# Patient Record
Sex: Female | Born: 1990 | Hispanic: Yes | Marital: Married | Smoking: Never smoker
Health system: Southern US, Community
[De-identification: ages and names within clinical notes are randomized; demographics above are authoritative.]

---

## 2019-08-08 ENCOUNTER — Encounter (HOSPITAL_COMMUNITY): Payer: Self-pay

## 2019-08-08 ENCOUNTER — Inpatient Hospital Stay (HOSPITAL_BASED_OUTPATIENT_CLINIC_OR_DEPARTMENT_OTHER): Payer: Medicaid Other

## 2019-08-08 ENCOUNTER — Other Ambulatory Visit: Payer: Self-pay

## 2019-08-08 ENCOUNTER — Inpatient Hospital Stay (HOSPITAL_COMMUNITY)
Admission: AD | Admit: 2019-08-08 | Discharge: 2019-08-08 | Disposition: A | Discharge status: Home / Self Care | Payer: Medicaid Other | Attending: Obstetrics and Gynecology | Admitting: Obstetrics and Gynecology

## 2019-08-08 DIAGNOSIS — R102 Pelvic and perineal pain: Secondary | ICD-10-CM | POA: Insufficient documentation

## 2019-08-08 DIAGNOSIS — O4703 False labor before 37 completed weeks of gestation, third trimester: Secondary | ICD-10-CM | POA: Diagnosis not present

## 2019-08-08 DIAGNOSIS — R109 Unspecified abdominal pain: Secondary | ICD-10-CM | POA: Diagnosis not present

## 2019-08-08 DIAGNOSIS — O26899 Other specified pregnancy related conditions, unspecified trimester: Secondary | ICD-10-CM | POA: Diagnosis not present

## 2019-08-08 DIAGNOSIS — Z3A28 28 weeks gestation of pregnancy: Secondary | ICD-10-CM | POA: Diagnosis not present

## 2019-08-08 DIAGNOSIS — O479 False labor, unspecified: Secondary | ICD-10-CM

## 2019-08-08 DIAGNOSIS — O26893 Other specified pregnancy related conditions, third trimester: Secondary | ICD-10-CM

## 2019-08-08 DIAGNOSIS — O4700 False labor before 37 completed weeks of gestation, unspecified trimester: Secondary | ICD-10-CM

## 2019-08-08 LAB — URINALYSIS, ROUTINE W REFLEX MICROSCOPIC
Bilirubin Urine: NEGATIVE
Glucose, UA: NEGATIVE mg/dL
Hgb urine dipstick: NEGATIVE
Ketones, ur: NEGATIVE mg/dL
Leukocytes,Ua: NEGATIVE
Nitrite: NEGATIVE
Protein, ur: NEGATIVE mg/dL
Specific Gravity, Urine: 1.01 (ref 1.005–1.030)
pH: 6 (ref 5.0–8.0)

## 2019-08-08 LAB — WET PREP, GENITAL
Clue Cells Wet Prep HPF POC: NONE SEEN
Sperm: NONE SEEN
Trich, Wet Prep: NONE SEEN
Yeast Wet Prep HPF POC: NONE SEEN

## 2019-08-08 LAB — COMPREHENSIVE METABOLIC PANEL
ALT: 22 U/L (ref 0–44)
AST: 19 U/L (ref 15–41)
Albumin: 3.2 g/dL — ABNORMAL LOW (ref 3.5–5.0)
Alkaline Phosphatase: 64 U/L (ref 38–126)
Anion gap: 5 (ref 5–15)
BUN: 9 mg/dL (ref 6–20)
CO2: 23 mmol/L (ref 22–32)
Calcium: 9.7 mg/dL (ref 8.9–10.3)
Chloride: 102 mmol/L (ref 98–111)
Creatinine, Ser: 0.56 mg/dL (ref 0.44–1.00)
GFR calc Af Amer: >60 mL/min (ref >60)
GFR calc non Af Amer: >60 mL/min (ref >60)
Glucose, Bld: 101 mg/dL — ABNORMAL HIGH (ref 70–99)
Potassium: 3.5 mmol/L (ref 3.5–5.1)
Sodium: 130 mmol/L — ABNORMAL LOW (ref 135–145)
Total Bilirubin: 0.6 mg/dL (ref 0.3–1.2)
Total Protein: 6.6 g/dL (ref 6.5–8.1)

## 2019-08-08 LAB — CBC WITH DIFFERENTIAL/PLATELET
Abs Immature Granulocytes: 0.12 10*3/uL — ABNORMAL HIGH (ref 0.00–0.07)
Basophils Absolute: 0 10*3/uL (ref 0.0–0.1)
Basophils Relative: 0 %
Eosinophils Absolute: 0.1 10*3/uL (ref 0.0–0.5)
Eosinophils Relative: 1 %
HCT: 32.1 % — ABNORMAL LOW (ref 36.0–46.0)
Hemoglobin: 11.4 g/dL — ABNORMAL LOW (ref 12.0–15.0)
Immature Granulocytes: 1 %
Lymphocytes Relative: 29 %
Lymphs Abs: 3.2 10*3/uL (ref 0.7–4.0)
MCH: 34.7 pg — ABNORMAL HIGH (ref 26.0–34.0)
MCHC: 35.5 g/dL (ref 30.0–36.0)
MCV: 97.6 fL (ref 80.0–100.0)
Monocytes Absolute: 0.8 10*3/uL (ref 0.1–1.0)
Monocytes Relative: 7 %
Neutro Abs: 6.8 10*3/uL (ref 1.7–7.7)
Neutrophils Relative %: 62 %
Platelets: 233 10*3/uL (ref 150–400)
RBC: 3.29 MIL/uL — ABNORMAL LOW (ref 3.87–5.11)
RDW: 13 % (ref 11.5–15.5)
WBC: 11.1 10*3/uL — ABNORMAL HIGH (ref 4.0–10.5)
nRBC: 0 % (ref 0.0–0.2)

## 2019-08-08 LAB — FETAL FIBRONECTIN: Fetal Fibronectin: NEGATIVE

## 2019-08-08 MED ORDER — IBUPROFEN 600 MG PO TABS
600.0000 mg | ORAL_TABLET | Freq: Once | ORAL | Status: AC
  Administered 2019-08-08: 20:00:00 600 mg via ORAL
  Filled 2019-08-08: qty 1

## 2019-08-08 MED ORDER — NIFEDIPINE 10 MG PO CAPS
10.0000 mg | ORAL_CAPSULE | ORAL | Status: AC
  Administered 2019-08-08 (×3): 10 mg via ORAL
  Filled 2019-08-08 (×3): qty 1

## 2019-08-08 NOTE — MAU Note (Signed)
Pt reports feeling pain and pressure lower abd that radiates to the back. Pt report LOF, however states she feels it's urine. Pt denies vag bleeding. Pt fetal movement. Pt reports urinary incont due to frequent infections.

## 2019-08-08 NOTE — MAU Provider Note (Addendum)
History     CSN: [id number]  Arrival date and time: 08/08/19 1740   First Provider Initiated Contact with Patient 08/08/19 1911      Chief Complaint  Patient presents with  . Abdominal Pain  . Vaginal Discharge   HPI   Ms.Alexa Smith is a 28 y.o. female G1P0 @ [redacted]w[redacted]d here in MAU with lower abdominal pain that has been present for 1 week. The pain is in her lower abdomen, in her pelvis. The pain comes and goes. She feels her abdomen getting tight.  Says she has been under a lot of stress due to a loss of a dog recently. She says that her husband has not been eating or sleeping since this happened and this has caused a lot of stress on the patient. She feels this stress has been causing contractions and pain in her abdomen. She herself is not eating or sleeping like she should. She does not drink water, she only drinks milk.   OB History    Gravida  1   Para      Term      Preterm      AB      Living        SAB      TAB      Ectopic      Multiple      Live Births           Obstetric Comments  IVF pregnancy         No past medical history on file.  History reviewed. No pertinent surgical history.  Family History  Problem Relation Age of Onset  . Diabetes Maternal Uncle   . Diabetes Paternal Grandmother   . Hypertension Mother     Social History   Tobacco Use  . Smoking status: Never Smoker  . Smokeless tobacco: Never Used  Substance Use Topics  . Alcohol use: Not Currently    Frequency: Never  . Drug use: Never    Allergies: No Known Allergies  No medications prior to admission.   Results for orders placed or performed during the hospital encounter of 08/08/19 (from the past 48 hour(s))  Urinalysis, Routine w reflex microscopic     Status: Abnormal   Collection Time: 08/08/19  7:10 PM  Result Value Ref Range   Color, Urine STRAW (A) YELLOW   APPearance CLEAR CLEAR   Specific Gravity, Urine 1.010 1.005 - 1.030   pH 6.0 5.0 - 8.0   Glucose, UA NEGATIVE NEGATIVE mg/dL   Hgb urine dipstick NEGATIVE NEGATIVE   Bilirubin Urine NEGATIVE NEGATIVE   Ketones, ur NEGATIVE NEGATIVE mg/dL   Protein, ur NEGATIVE NEGATIVE mg/dL   Nitrite NEGATIVE NEGATIVE   Leukocytes,Ua NEGATIVE NEGATIVE    Comment: Performed at [facility] [address], [place], [place] [address]  Wet prep, genital     Status: Abnormal   Collection Time: 08/08/19  7:47 PM  Result Value Ref Range   Yeast Wet Prep HPF POC NONE SEEN NONE SEEN   Trich, Wet Prep NONE SEEN NONE SEEN   Clue Cells Wet Prep HPF POC NONE SEEN NONE SEEN   WBC, Wet Prep HPF POC MANY (A) NONE SEEN   Sperm NONE SEEN     Comment: Performed at [facility], [place] [address] [place], [place] [address]  Fetal fibronectin     Status: None   Collection Time: 08/08/19  7:47 PM  Result Value Ref Range  Fetal Fibronectin NEGATIVE NEGATIVE    Comment: Performed at [facility], [address] [place], [place] [address]  CBC with Differential/Platelet     Status: Abnormal   Collection Time: 08/08/19  8:26 PM  Result Value Ref Range   WBC 11.1 (H) 4.0 - 10.5 K/uL   RBC 3.29 (L) 3.87 - 5.11 MIL/uL   Hemoglobin 11.4 (L) 12.0 - 15.0 g/dL   HCT 60.432.1 (L) 54.036.0 - 98.146.0 %   MCV 97.6 80.0 - 100.0 fL   MCH 34.7 (H) 26.0 - 34.0 pg   MCHC 35.5 30.0 - 36.0 g/dL   RDW 19.113.0 47.811.5 - 29.515.5 %   Platelets 233 150 - 400 K/uL   nRBC 0.0 0.0 - 0.2 %   Neutrophils Relative % 62 %   Neutro Abs 6.8 1.7 - 7.7 K/uL   Lymphocytes Relative 29 %   Lymphs Abs 3.2 0.7 - 4.0 K/uL   Monocytes Relative 7 %   Monocytes Absolute 0.8 0.1 - 1.0 K/uL   Eosinophils Relative 1 %   Eosinophils Absolute 0.1 0.0 - 0.5 K/uL   Basophils Relative 0 %   Basophils Absolute 0.0 0.0 - 0.1 K/uL   Immature Granulocytes 1 %   Abs Immature Granulocytes 0.12 (H) 0.00 - 0.07 K/uL    Comment: Performed at [facility], [address] [place], [place] [address]    Review of Systems  Constitutional: Negative for  fever.  Gastrointestinal: Positive for abdominal pain.  Genitourinary: Positive for pelvic pain. Negative for dysuria, frequency, vaginal bleeding and vaginal discharge.   Physical Exam   Blood pressure 117/79, pulse 86, temperature 98.6 F (37 C), temperature source Oral, resp. rate 20, height 5\' 7"  (1.702 m), weight 77.2 kg, SpO2 100 %.  Physical Exam  Constitutional: She is oriented to person, place, and time. She appears well-developed and well-nourished. No distress.  HENT:  Head: Normocephalic.  Eyes: Pupils are equal, round, and reactive to light.  GI: Normal appearance. There is generalized abdominal tenderness. There is no rigidity, no rebound and no guarding.  Genitourinary:    Genitourinary Comments: Vagina - Small amount of white vaginal discharge, no odor  Cervix - No contact bleeding, no active bleeding. Cervix appears slightly open  Bimanual exam: Cervix FT, middle  GC/Chlam, wet prep done Chaperone present for exam.    Musculoskeletal: Normal range of motion.  Neurological: She is alert and oriented to person, place, and time.  Skin: Skin is warm. She is not diaphoretic.  Psychiatric: Her behavior is normal.   Fetal Tracing: Baseline: 135 bpm Variability: moderate  Accelerations: 10x10 Decelerations: None Toco: Occasional contractions MAU Course  Procedures  None  MDM  Wet prep collected  FFN collected- Negative  Ibuprofen 600 mg po given  Contractions noted on monitor: procardia given oral  US Limited  Report given to Dr. Crissie ReeseEckstat who resumes care of the patient. Patient is in US at this time.  Alexa Smith, Alexa RutherfordJennifer Smith, Alexa Smith  Assessment and Plan     Addendum:  US unremarkable. Wet prep negative. Cervix rechecked and remains closed. Patient reports she feels better and abdominal pain feels more like gas and less like it did this morning. Continues to have intermittent contractions on toco but given no cervical change not in preterm labor, and negative FFN  is reassuring. Discussed preterm labor precautions at length with patient.  D/c to home in stable condition.   Alexa MaplesMatthew M Braxdon Smith

## 2019-08-08 NOTE — Discharge Instructions (Signed)
Preterm Labor and Birth Information °Pregnancy normally lasts 39-41 weeks. Preterm labor is when labor starts early. It starts before you have been pregnant for 37 whole weeks. °What are the risk factors for preterm labor? °Preterm labor is more likely to occur in women who: °· Have an infection while pregnant. °· Have a cervix that is short. °· Have gone into preterm labor before. °· Have had surgery on their cervix. °· Are younger than age 30. °· Are older than age 35. °· Are African American. °· Are pregnant with two or more babies. °· Take street drugs while pregnant. °· Smoke while pregnant. °· Do not gain enough weight while pregnant. °· Got pregnant right after another pregnancy. °What are the symptoms of preterm labor? °Symptoms of preterm labor include: °· Cramps. The cramps may feel like the cramps some women get during their period. The cramps may happen with watery poop (diarrhea). °· Pain in the belly (abdomen). °· Pain in the lower back. °· Regular contractions or tightening. It may feel like your belly is getting tighter. °· Pressure in the lower belly that seems to get stronger. °· More fluid (discharge) leaking from the vagina. The fluid may be watery or bloody. °· Water breaking. °Why is it important to notice signs of preterm labor? °Babies who are born early may not be fully developed. They have a higher chance for: °· Long-term heart problems. °· Long-term lung problems. °· Trouble controlling body systems, like breathing. °· Bleeding in the brain. °· A condition called cerebral palsy. °· Learning difficulties. °· Death. °These risks are highest for babies who are born before 34 weeks of pregnancy. °How is preterm labor treated? °Treatment depends on: °· How long you were pregnant. °· Your condition. °· The health of your baby. °Treatment may involve: °· Having a stitch (suture) placed in your cervix. When you give birth, your cervix opens so the baby can come out. The stitch keeps the cervix  from opening too soon. °· Staying at the hospital. °· Taking or getting medicines, such as: °? Hormone medicines. °? Medicines to stop contractions. °? Medicines to help the baby’s lungs develop. °? Medicines to prevent your baby from having cerebral palsy. °What should I do if I am in preterm labor? °If you think you are going into labor too soon, call your doctor right away. °How can I prevent preterm labor? °· Do not use any tobacco products. °? Examples of these are cigarettes, chewing tobacco, and e-cigarettes. °? If you need help quitting, ask your doctor. °· Do not use street drugs. °· Do not use any medicines unless you ask your doctor if they are safe for you. °· Talk with your doctor before taking any herbal supplements. °· Make sure you gain enough weight. °· Watch for infection. If you think you might have an infection, get it checked right away. °· If you have gone into preterm labor before, tell your doctor. °This information is not intended to replace advice given to you by your health care provider. Make sure you discuss any questions you have with your health care provider. °Document Released: 01/31/2009 Document Revised: 02/26/2019 Document Reviewed: 03/27/2016 °Elsevier Patient Education © 2020 [employer] ° °

## 2019-08-08 NOTE — MAU Note (Signed)
Alexa Smith is a 28 y.o. at [redacted]w[redacted]d here in MAU reporting: for the past week she has been under a lot of stress at work and with her dog dying. Starting yesterday she feels like her belly is lower and she is having a lot of pressure and her belly has been feeling tight and is painful. Also having back pain. No bleeding. Unsure if she is leaking fluid, states she has noticed increased dampness in her panties for the past 2 days. +FM  Was getting [facility] in [place], last appointment was 08/31. States she is planning to deliver here but is thinking she will continue care in [place]  Onset of complaint: yesterday  Pain score: abdominal pain 4/10, back pain 4/10  Vitals:   08/08/19 1803  BP: 134/70  Pulse: 95  Resp: 18  Temp: 98.5 F (36.9 C)  SpO2: 99%     FHT: +FM  Lab orders placed from triage: UA

## 2022-02-22 ENCOUNTER — Emergency Department
Admission: EM | Admit: 2022-02-22 | Discharge: 2022-02-23 | Disposition: A | Discharge status: Home / Self Care | Payer: Medicaid Other | Attending: Emergency Medicine | Admitting: Emergency Medicine

## 2022-02-22 ENCOUNTER — Other Ambulatory Visit: Payer: Self-pay

## 2022-02-22 ENCOUNTER — Emergency Department: Payer: Medicaid Other

## 2022-02-22 DIAGNOSIS — Z3A01 Less than 8 weeks gestation of pregnancy: Secondary | ICD-10-CM | POA: Insufficient documentation

## 2022-02-22 DIAGNOSIS — R102 Pelvic and perineal pain: Secondary | ICD-10-CM

## 2022-02-22 DIAGNOSIS — O219 Vomiting of pregnancy, unspecified: Secondary | ICD-10-CM | POA: Insufficient documentation

## 2022-02-22 DIAGNOSIS — O99611 Diseases of the digestive system complicating pregnancy, first trimester: Secondary | ICD-10-CM | POA: Diagnosis not present

## 2022-02-22 DIAGNOSIS — O26899 Other specified pregnancy related conditions, unspecified trimester: Secondary | ICD-10-CM

## 2022-02-22 LAB — URINALYSIS, ROUTINE W REFLEX MICROSCOPIC
Bilirubin Urine: NEGATIVE
Glucose, UA: NEGATIVE mg/dL
Hgb urine dipstick: NEGATIVE
Ketones, ur: NEGATIVE mg/dL
Leukocytes,Ua: NEGATIVE
Nitrite: NEGATIVE
Protein, ur: NEGATIVE mg/dL
Specific Gravity, Urine: 1.012 (ref 1.005–1.030)
pH: 6 (ref 5.0–8.0)

## 2022-02-22 LAB — CBC
HCT: 36.4 % (ref 36.0–46.0)
Hemoglobin: 12.1 g/dL (ref 12.0–15.0)
MCH: 31.1 pg (ref 26.0–34.0)
MCHC: 33.2 g/dL (ref 30.0–36.0)
MCV: 93.6 fL (ref 80.0–100.0)
Platelets: 267 10*3/uL (ref 150–400)
RBC: 3.89 MIL/uL (ref 3.87–5.11)
RDW: 13.1 % (ref 11.5–15.5)
WBC: 7.4 10*3/uL (ref 4.0–10.5)
nRBC: 0 % (ref 0.0–0.2)

## 2022-02-22 LAB — POC URINE PREG, ED: Preg Test, Ur: POSITIVE — AB

## 2022-02-22 MED ORDER — ONDANSETRON HCL 4 MG/2ML IJ SOLN
4.0000 mg | Freq: Once | INTRAMUSCULAR | Status: AC
  Administered 2022-02-22: 4 mg via INTRAVENOUS
  Filled 2022-02-22: qty 2

## 2022-02-22 MED ORDER — ACETAMINOPHEN 500 MG PO TABS
1000.0000 mg | ORAL_TABLET | Freq: Once | ORAL | Status: AC
  Administered 2022-02-22: 1000 mg via ORAL
  Filled 2022-02-22: qty 2

## 2022-02-22 MED ORDER — LACTATED RINGERS IV BOLUS
1000.0000 mL | Freq: Once | INTRAVENOUS | Status: AC
  Administered 2022-02-22: 1000 mL via INTRAVENOUS

## 2022-02-22 NOTE — ED Triage Notes (Signed)
Epigastric pain through to back since this AM, worsening tonight. (+)nausea, no vomiting. 2 episodes of loose stools. Reports child has stomach virus. Reports (+)HPT last Thursday. Denies vaginal bleeding.  ?

## 2022-02-22 NOTE — ED Provider Notes (Signed)
? ?[facility] ?Provider Note ? ? ? Event Date/Time  ? First MD Initiated Contact with Patient 02/22/22 2317   ?  (approximate) ? ? ?History  ? ?Chief Complaint ?Abdominal Pain ? ? ?HPI ? ?Alexa Smith is a 31 y.o. female, [phone] with no significant past medical history presents to the ED complaining of complaining of abdominal pain.  Patient reports that she has been dealing with with gradually worsening pain in her epigastrium over the past 12 hours.  She describes pain as sharp and constant, associated with nausea and multiple episodes of vomiting.  She denies any changes in her bowel movements and has not had any dysuria, hematuria, vaginal bleeding, or discharge.  She additionally denies any fevers or flank pain.  She believes she is about [redacted] weeks pregnant at this time, states her LMP was on February 22.  She has not yet established care with OB/GYN, has an appointment later this month, but has not yet had an ultrasound.  She reports occasional "gas pain" with this pregnancy thus far, but otherwise denies any significant issues.  She has never had any abdominal surgeries beyond prior cesarean section. ? ?  ? ? ?Physical Exam  ? ?Triage Vital Signs: ?ED Triage Vitals  ?Enc Vitals Group  ?   BP 02/22/22 2301 115/76  ?   Pulse Rate 02/22/22 2301 88  ?   Resp 02/22/22 2301 20  ?   Temp 02/22/22 2301 98.9 ?F (37.2 ?C)  ?   Temp Source 02/22/22 2301 Oral  ?   SpO2 02/22/22 2301 99 %  ?   Weight 02/22/22 2303 154 lb (69.9 kg)  ?   Height 02/22/22 2303 5\' 7"  (1.702 m)  ?   Head Circumference --   ?   Peak Flow --   ?   Pain Score 02/22/22 2302 9  ?   Pain Loc --   ?   Pain Edu? --   ?   Excl. in GC? --   ? ? ?Most recent vital signs: ?Vitals:  ? 02/22/22 2301 02/23/22 0000  ?BP: 115/76 111/83  ?Pulse: 88 82  ?Resp: 20 20  ?Temp: 98.9 ?F (37.2 ?C)   ?SpO2: 99% 100%  ? ? ?Constitutional: Alert and oriented. ?Eyes: Conjunctivae are normal. ?Head: Atraumatic. ?Nose: No  congestion/rhinnorhea. ?Mouth/Throat: Mucous membranes are moist.  ?Cardiovascular: Normal rate, regular rhythm. Grossly normal heart sounds.  2+ radial pulses bilaterally. ?Respiratory: Normal respiratory effort.  No retractions. Lungs CTAB. ?Gastrointestinal: Soft and tender to palpation in the epigastrium with no rebound or guarding. No distention. ?Musculoskeletal: No lower extremity tenderness nor edema.  ?Neurologic:  Normal speech and language. No gross focal neurologic deficits are appreciated. ? ? ? ?ED Results / Procedures / Treatments  ? ?Labs ?(all labs ordered are listed, but only abnormal results are displayed) ?Labs Reviewed  ?COMPREHENSIVE METABOLIC PANEL - Abnormal; Notable for the following components:  ?    Result Value  ? Sodium 134 (*)   ? Glucose, Bld 144 (*)   ? All other components within normal limits  ?URINALYSIS, ROUTINE W REFLEX MICROSCOPIC - Abnormal; Notable for the following components:  ? Color, Urine YELLOW (*)   ? APPearance CLEAR (*)   ? All other components within normal limits  ?HCG, QUANTITATIVE, PREGNANCY - Abnormal; Notable for the following components:  ? hCG, Beta 04/25/22 20,335 (*)   ? All other components within normal limits  ?POC URINE PREG, ED -  Abnormal; Notable for the following components:  ? Preg Test, Ur POSITIVE (*)   ? All other components within normal limits  ?LIPASE, BLOOD  ?CBC  ? ? ? ?RADIOLOGY ?Right upper quadrant ultrasound reviewed by me with no gallstones, wall thickening, or pericholecystic fluid.  Pelvic ultrasound shows intrauterine gestational sac and yolk sac with no visible embryo. ? ?PROCEDURES: ? ?Critical Care performed: No ? ?Procedures ? ? ?MEDICATIONS ORDERED IN ED: ?Medications  ?lactated ringers bolus 1,000 mL (1,000 mLs Intravenous New Bag/Given 02/22/22 2354)  ?ondansetron [facility]) injection 4 mg (4 mg Intravenous Given 02/22/22 2356)  ?acetaminophen (TYLENOL) tablet 1,000 mg (1,000 mg Oral Given 02/22/22 2356)  ? ? ? ?IMPRESSION / MDM  / ASSESSMENT AND PLAN / ED COURSE  ?I reviewed the triage vital signs and the nursing notes. ?             ?               ? ?31 y.o. female G3P1011 at approximately 6 weeks of pregnancy with no significant past medical history presents to the ED complaining of 12 hours of worsening epigastric pain associated with nausea and vomiting. ? ?Differential diagnosis includes, but is not limited to, ectopic pregnancy, hyperemesis gravidarum, biliary colic, pancreatitis, hepatitis, choledocholithiasis, gastritis, GERD, UTI. ? ?Patient is nontoxic-appearing and in no acute distress, vital signs are unremarkable but she does have significant tenderness in her epigastrium.  We will further assess with ultrasound for ectopic pregnancy as well as biliary pathology.  Point-of-care pregnancy testing is positive and beta hCG is pending.  Additional labs including LFTs and lipase are also pending.  Patient was offered IV pain medication but prefers to proceed with Tylenol at this time, we will also hydrate with IV fluids and treat with IV Zofran. ? ?Labs are reassuring with beta hCG levels appropriately elevated, CBC without anemia or leukocytosis, LFTs and lipase are within normal limits.  Right upper quadrant ultrasound shows no evidence of biliary pathology and pelvic ultrasound shows intrauterine gestational sac and yolk sac with no visible embryo.  Given patient's dates with no visible embryo, presentation concerning for possible early miscarriage or failed pregnancy.  Patient is appropriate for discharge home, but was counseled to closely follow-up with her OB/GYN and to return to the ED for new worsening symptoms.  Patient agrees with plan. ? ?  ? ? ?FINAL CLINICAL IMPRESSION(S) / ED DIAGNOSES  ? ?Final diagnoses:  ?Abdominal pain affecting pregnancy  ? ? ? ?Rx / DC Orders  ? ?ED Discharge Orders   ? ?      Ordered  ?  ondansetron (ZOFRAN-ODT) 4 MG disintegrating tablet  Every 8 hours PRN       ? 02/23/22 0222  ? ?  ?  ? ?   ? ? ? ?Note:  This document was prepared using Dragon voice recognition software and may include unintentional dictation errors. ?  ?Chesley Noon, MD ?02/23/22 [phone] ? ?

## 2022-02-22 NOTE — ED Notes (Addendum)
Pt to ED for Upper abdominal pain that started this AM. Pt states she feels like a pressure, burning sensation that is in RUQ and shoulder. Pt has episodes of nausea, but denies emesis. Pt states she is also pregnant, last known period was around feb 28th, states she normally has irregular periods. Denies any new vaginal bleeding. Denies diarrhea and urinary symptoms ? ?Pt is A&OX4  ?

## 2022-02-23 ENCOUNTER — Emergency Department: Payer: Medicaid Other

## 2022-02-23 LAB — COMPREHENSIVE METABOLIC PANEL
ALT: 13 U/L (ref 0–44)
AST: 17 U/L (ref 15–41)
Albumin: 4.2 g/dL (ref 3.5–5.0)
Alkaline Phosphatase: 50 U/L (ref 38–126)
Anion gap: 7 (ref 5–15)
BUN: 8 mg/dL (ref 6–20)
CO2: 23 mmol/L (ref 22–32)
Calcium: 9.8 mg/dL (ref 8.9–10.3)
Chloride: 104 mmol/L (ref 98–111)
Creatinine, Ser: 0.62 mg/dL (ref 0.44–1.00)
GFR, Estimated: >60 mL/min (ref >60)
Glucose, Bld: 144 mg/dL — ABNORMAL HIGH (ref 70–99)
Potassium: 3.5 mmol/L (ref 3.5–5.1)
Sodium: 134 mmol/L — ABNORMAL LOW (ref 135–145)
Total Bilirubin: 0.8 mg/dL (ref 0.3–1.2)
Total Protein: 7.3 g/dL (ref 6.5–8.1)

## 2022-02-23 LAB — LIPASE, BLOOD: Lipase: 25 U/L (ref 11–51)

## 2022-02-23 LAB — HCG, QUANTITATIVE, PREGNANCY: hCG, Beta Chain, Quant, S: 20335 m[IU]/mL — ABNORMAL HIGH (ref <5)

## 2022-02-23 MED ORDER — ONDANSETRON 4 MG PO TBDP: 4.0000 mg | ORAL_TABLET | Freq: Three times a day (TID) | ORAL | 0 refills | Status: AC | PRN

## 2022-12-29 IMAGING — US US OB < 14 WEEKS - US OB TV
1 series · 15 of 28 positions shown · non-contrast
Comparison: None.

CLINICAL DATA: Abdomen pain

EXAM:
OBSTETRIC <14 WK US AND TRANSVAGINAL OB US
TECHNIQUE: Both transabdominal and transvaginal ultrasound examinations were
performed for complete evaluation of the gestation as well as the
maternal uterus, adnexal regions, and pelvic cul-de-sac.
Transvaginal technique was performed to assess early pregnancy.

[Series 1: us ob comp less 14 wks · 15 of 79 slices shown]
[im 1/79]
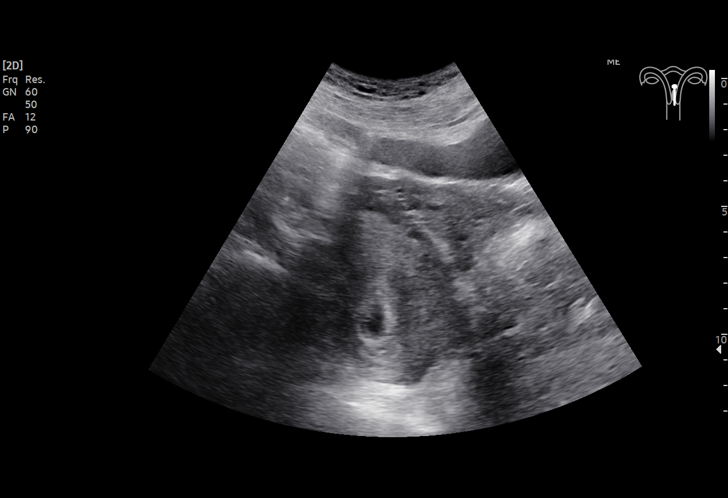
[im 6/79]
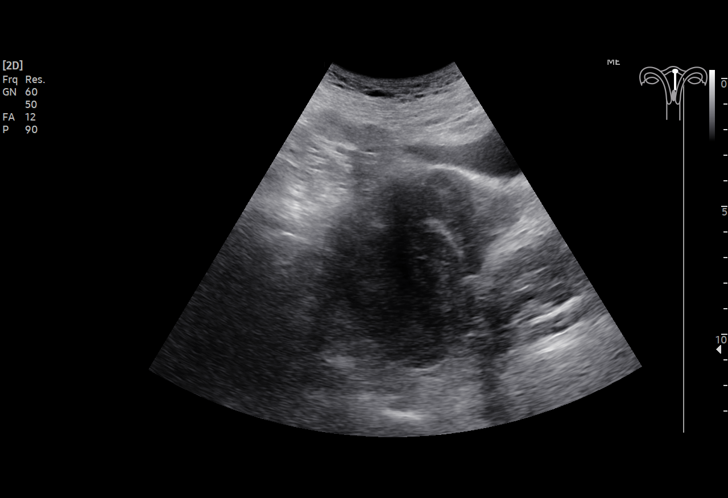
[im 12/79]
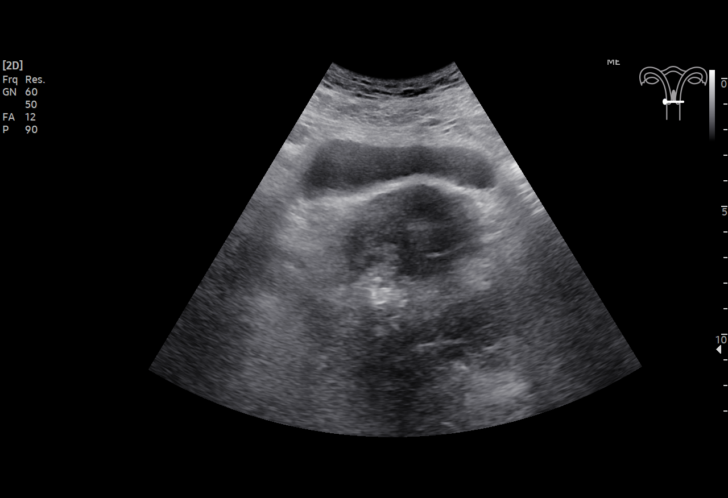
[im 18/79]
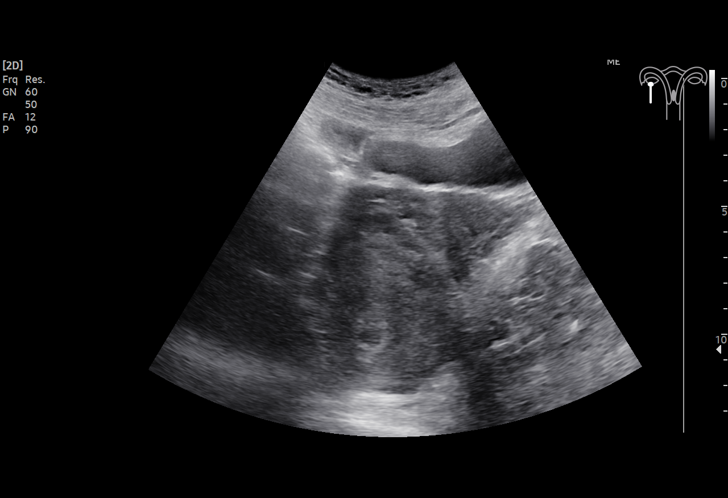
[im 24/79]
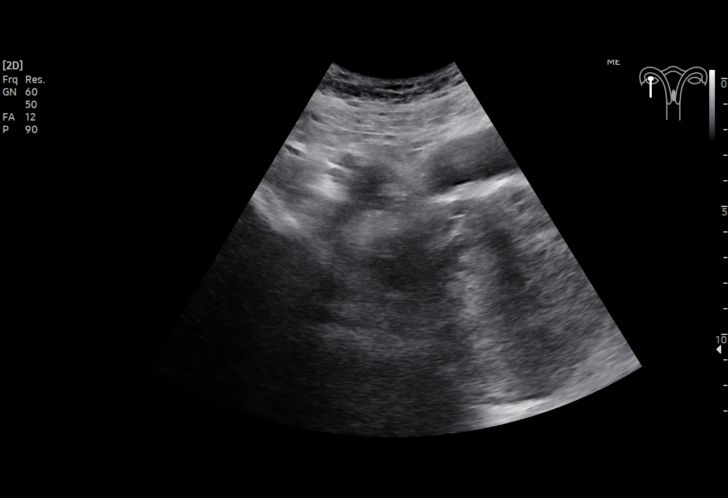
[im 29/79]
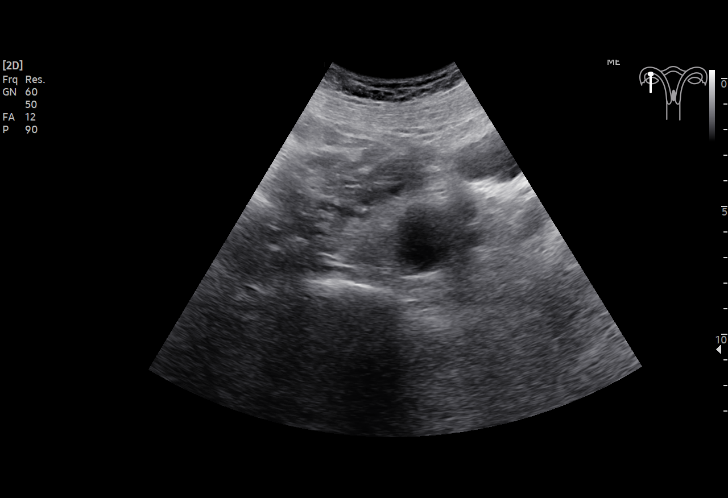
[im 35/79]
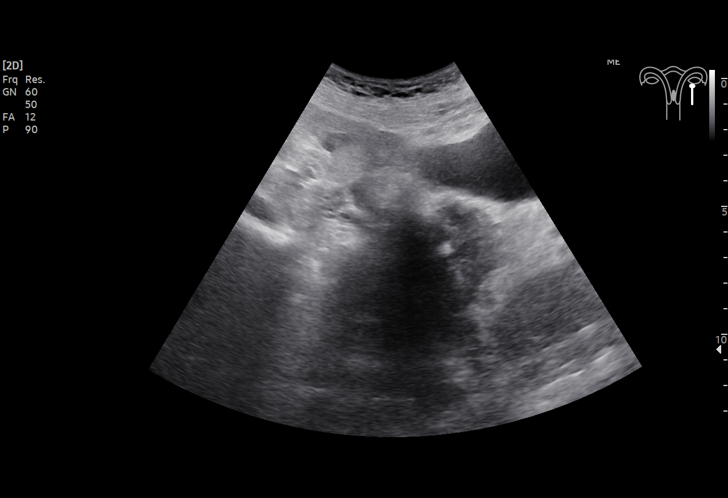
[im 41/79]
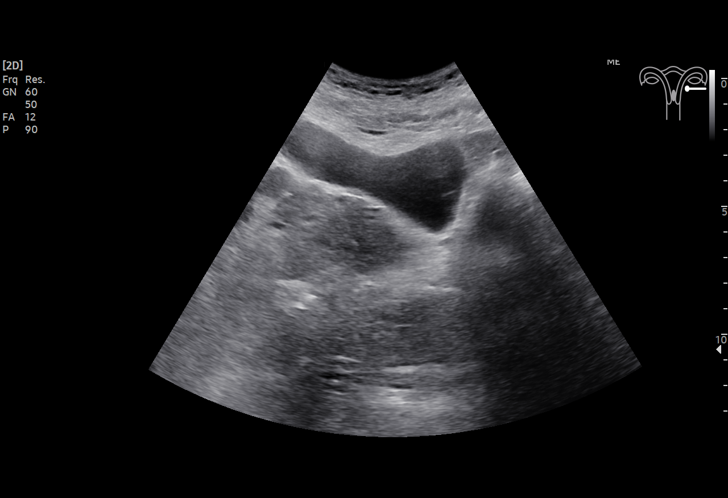
[im 44/79]
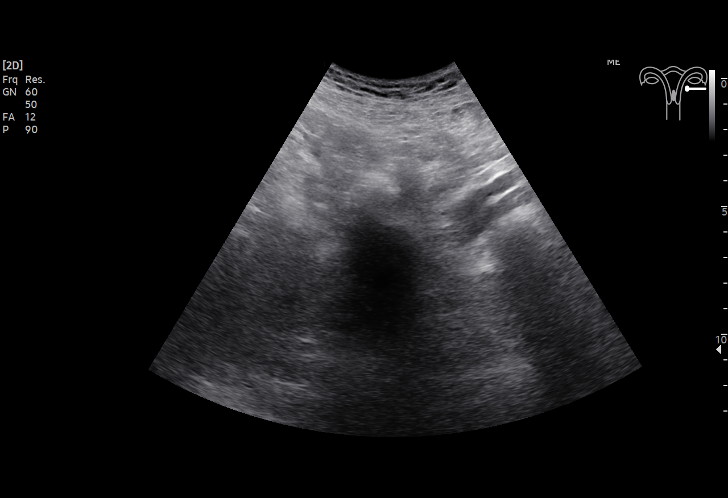
[im 50/79]
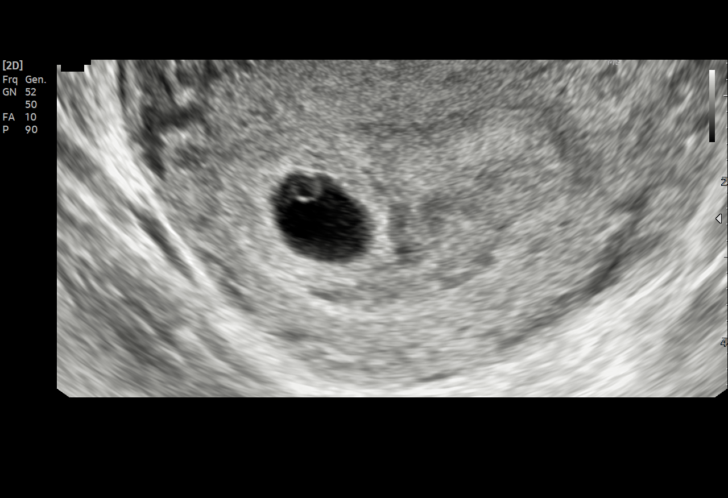
[im 55/79]
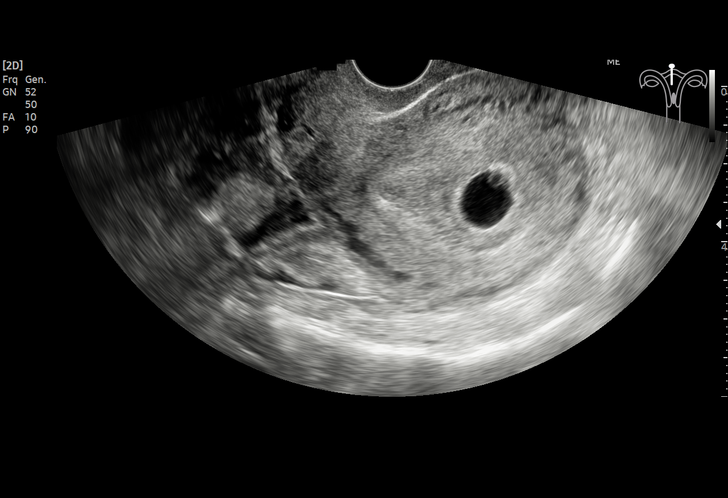
[im 61/79]
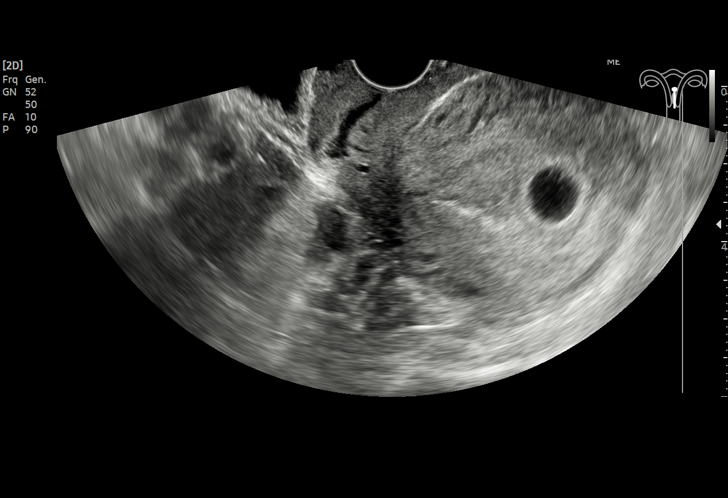
[im 67/79]
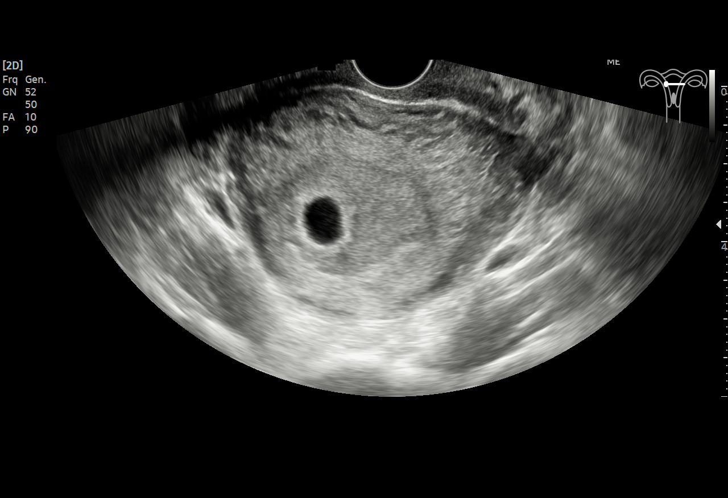
[im 73/79]
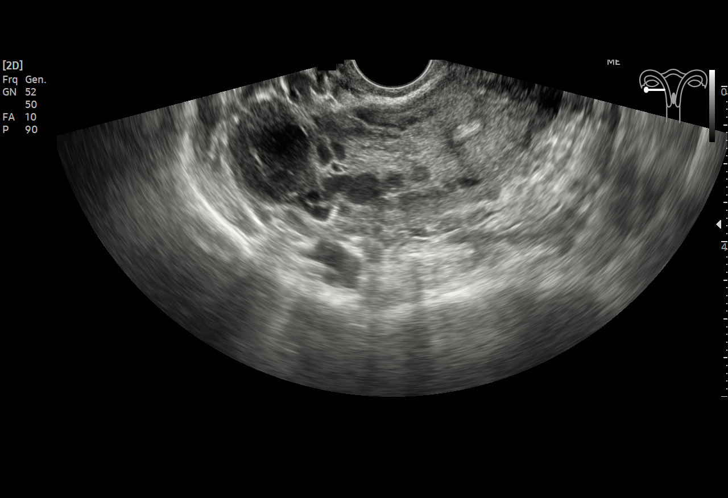
[im 79/79]
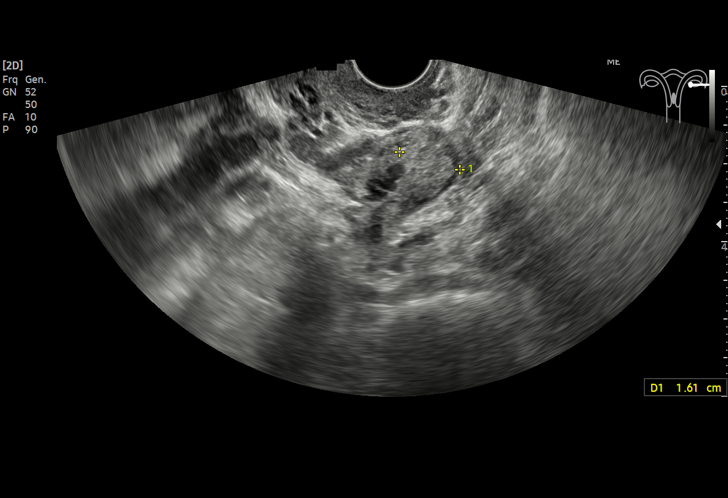

[15 of 28 positions shown; findings below may reference images not displayed]

FINDINGS: Intrauterine gestational sac: Single

Yolk sac:  Visualized

Embryo:  Not visualized

MSD: 10.8 mm   5 w   6 d

Subchorionic hemorrhage:  None visualized.

Maternal uterus/adnexae: Ovaries are within normal limits. The right
ovary measures 2.7 x 2.2 x 2.3 cm and contains a corpus luteum. Left
ovary measures 2.6 x 1.3 x 1.6 cm. No significant free fluid
IMPRESSION: Single intrauterine pregnancy with visualized gestational sac and
yolk sac but no embryo. Suggest ultrasound follow-up in 10-14 days
to confirm viability.
